# Patient Record
Sex: Male | Born: 1970 | Race: White | Hispanic: No | Marital: Single | Smoking: Current every day smoker
Health system: Southern US, Community
[De-identification: ages and names within clinical notes are randomized; demographics above are authoritative.]

---

## 2014-07-14 ENCOUNTER — Encounter (HOSPITAL_COMMUNITY): Payer: Self-pay | Admitting: Emergency Medicine

## 2014-07-14 ENCOUNTER — Emergency Department (HOSPITAL_COMMUNITY): Payer: BLUE CROSS/BLUE SHIELD

## 2014-07-14 ENCOUNTER — Emergency Department (HOSPITAL_COMMUNITY)
Admission: EM | Admit: 2014-07-14 | Discharge: 2014-07-14 | Disposition: A | Discharge status: Home / Self Care | Payer: BLUE CROSS/BLUE SHIELD | Attending: Emergency Medicine | Admitting: Emergency Medicine

## 2014-07-14 DIAGNOSIS — Y998 Other external cause status: Secondary | ICD-10-CM | POA: Diagnosis not present

## 2014-07-14 DIAGNOSIS — S8391XA Sprain of unspecified site of right knee, initial encounter: Secondary | ICD-10-CM | POA: Diagnosis not present

## 2014-07-14 DIAGNOSIS — Y9389 Activity, other specified: Secondary | ICD-10-CM | POA: Insufficient documentation

## 2014-07-14 DIAGNOSIS — X58XXXA Exposure to other specified factors, initial encounter: Secondary | ICD-10-CM | POA: Diagnosis not present

## 2014-07-14 DIAGNOSIS — Z720 Tobacco use: Secondary | ICD-10-CM | POA: Insufficient documentation

## 2014-07-14 DIAGNOSIS — Y92810 Car as the place of occurrence of the external cause: Secondary | ICD-10-CM | POA: Insufficient documentation

## 2014-07-14 DIAGNOSIS — S8991XA Unspecified injury of right lower leg, initial encounter: Secondary | ICD-10-CM | POA: Diagnosis present

## 2014-07-14 MED ORDER — OXYCODONE-ACETAMINOPHEN 5-325 MG PO TABS
1.0000 | ORAL_TABLET | Freq: Once | ORAL | Status: AC
  Administered 2014-07-14: 1 via ORAL
  Filled 2014-07-14: qty 1

## 2014-07-14 MED ORDER — IBUPROFEN 600 MG PO TABS: 600.0000 mg | ORAL_TABLET | Freq: Four times a day (QID) | ORAL | Status: AC | PRN

## 2014-07-14 MED ORDER — TRAMADOL HCL 50 MG PO TABS: 50.0000 mg | ORAL_TABLET | Freq: Four times a day (QID) | ORAL | Status: AC | PRN

## 2014-07-14 NOTE — ED Notes (Signed)
Declined W/C at D/C and was escorted to lobby by RN. 

## 2014-07-14 NOTE — Discharge Instructions (Signed)
Ibuprofen for pain. Tramadol for severe pain. Keep the knee elevated. Ice several times a day. Ace wrap for compression, swelling, support. Crutches fibrillation as needed. Please follow with her primary care doctor or orthopedic specialist. I have given your referral to an orthopedic specialist. See resource guide to left primary care doctor.   Knee Pain The knee is the complex joint between your thigh and your lower leg. It is made up of bones, tendons, ligaments, and cartilage. The bones that make up the knee are:  The femur in the thigh.  The tibia and fibula in the lower leg.  The patella or kneecap riding in the groove on the lower femur. CAUSES  Knee pain is a common complaint with many causes. A few of these causes are:  Injury, such as:  A ruptured ligament or tendon injury.  Torn cartilage.  Medical conditions, such as:  Gout  Arthritis  Infections  Overuse, over training, or overdoing a physical activity. Knee pain can be minor or severe. Knee pain can accompany debilitating injury. Minor knee problems often respond well to self-care measures or get well on their own. More serious injuries may need medical intervention or even surgery. SYMPTOMS The knee is complex. Symptoms of knee problems can vary widely. Some of the problems are:  Pain with movement and weight bearing.  Swelling and tenderness.  Buckling of the knee.  Inability to straighten or extend your knee.  Your knee locks and you cannot straighten it.  Warmth and redness with pain and fever.  Deformity or dislocation of the kneecap. DIAGNOSIS  Determining what is wrong may be very straight forward such as when there is an injury. It can also be challenging because of the complexity of the knee. Tests to make a diagnosis may include:  Your caregiver taking a history and doing a physical exam.  Routine X-rays can be used to rule out other problems. X-rays will not reveal a cartilage tear. Some  injuries of the knee can be diagnosed by:  Arthroscopy a surgical technique by which a small video camera is inserted through tiny incisions on the sides of the knee. This procedure is used to examine and repair internal knee joint problems. Tiny instruments can be used during arthroscopy to repair the torn knee cartilage (meniscus).  Arthrography is a radiology technique. A contrast liquid is directly injected into the knee joint. Internal structures of the knee joint then become visible on X-ray film.  An MRI scan is a non X-ray radiology procedure in which magnetic fields and a computer produce two- or three-dimensional images of the inside of the knee. Cartilage tears are often visible using an MRI scanner. MRI scans have largely replaced arthrography in diagnosing cartilage tears of the knee.  Blood work.  Examination of the fluid that helps to lubricate the knee joint (synovial fluid). This is done by taking a sample out using a needle and a syringe. TREATMENT The treatment of knee problems depends on the cause. Some of these treatments are:  Depending on the injury, proper casting, splinting, surgery, or physical therapy care will be needed.  Give yourself adequate recovery time. Do not overuse your joints. If you begin to get sore during workout routines, back off. Slow down or do fewer repetitions.  For repetitive activities such as cycling or running, maintain your strength and nutrition.  Alternate muscle groups. For example, if you are a weight lifter, work the upper body on one day and the lower body the  next.  Either tight or weak muscles do not give the proper support for your knee. Tight or weak muscles do not absorb the stress placed on the knee joint. Keep the muscles surrounding the knee strong.  Take care of mechanical problems.  If you have flat feet, orthotics or special shoes may help. See your caregiver if you need help.  Arch supports, sometimes with wedges on the  inner or outer aspect of the heel, can help. These can shift pressure away from the side of the knee most bothered by osteoarthritis.  A brace called an "unloader" brace also may be used to help ease the pressure on the most arthritic side of the knee.  If your caregiver has prescribed crutches, braces, wraps or ice, use as directed. The acronym for this is PRICE. This means protection, rest, ice, compression, and elevation.  Nonsteroidal anti-inflammatory drugs (NSAIDs), can help relieve pain. But if taken immediately after an injury, they may actually increase swelling. Take NSAIDs with food in your stomach. Stop them if you develop stomach problems. Do not take these if you have a history of ulcers, stomach pain, or bleeding from the bowel. Do not take without your caregiver's approval if you have problems with fluid retention, heart failure, or kidney problems.  For ongoing knee problems, physical therapy may be helpful.  Glucosamine and chondroitin are over-the-counter dietary supplements. Both may help relieve the pain of osteoarthritis in the knee. These medicines are different from the usual anti-inflammatory drugs. Glucosamine may decrease the rate of cartilage destruction.  Injections of a corticosteroid drug into your knee joint may help reduce the symptoms of an arthritis flare-up. They may provide pain relief that lasts a few months. You may have to wait a few months between injections. The injections do have a small increased risk of infection, water retention, and elevated blood sugar levels.  Hyaluronic acid injected into damaged joints may ease pain and provide lubrication. These injections may work by reducing inflammation. A series of shots may give relief for as long as 6 months.  Topical painkillers. Applying certain ointments to your skin may help relieve the pain and stiffness of osteoarthritis. Ask your pharmacist for suggestions. Many over the-counter products are approved  for temporary relief of arthritis pain.  In some countries, doctors often prescribe topical NSAIDs for relief of chronic conditions such as arthritis and tendinitis. A review of treatment with NSAID creams found that they worked as well as oral medications but without the serious side effects. PREVENTION  Maintain a healthy weight. Extra pounds put more strain on your joints.  Get strong, stay limber. Weak muscles are a common cause of knee injuries. Stretching is important. Include flexibility exercises in your workouts.  Be smart about exercise. If you have osteoarthritis, chronic knee pain or recurring injuries, you may need to change the way you exercise. This does not mean you have to stop being active. If your knees ache after jogging or playing basketball, consider switching to swimming, water aerobics, or other low-impact activities, at least for a few days a week. Sometimes limiting high-impact activities will provide relief.  Make sure your shoes fit well. Choose footwear that is right for your sport.  Protect your knees. Use the proper gear for knee-sensitive activities. Use kneepads when playing volleyball or laying carpet. Buckle your seat belt every time you drive. Most shattered kneecaps occur in car accidents.  Rest when you are tired. SEEK MEDICAL CARE IF:  You have knee pain  that is continual and does not seem to be getting better.  SEEK IMMEDIATE MEDICAL CARE IF:  Your knee joint feels hot to the touch and you have a high fever. MAKE SURE YOU:   Understand these instructions.  Will watch your condition.  Will get help right away if you are not doing well or get worse. Document Released: 10/25/2006 Document Revised: 03/22/2011 Document Reviewed: 10/25/2006 [facility] Patient Information 2015 [place], [place]. This information is not intended to replace advice given to you by your health care provider. Make sure you discuss any questions you have with your health care  provider.   Emergency Department Resource Guide 1) Find a Doctor and Pay Out of Pocket Although you won't have to find out who is covered by your insurance plan, it is a good idea to ask around and get recommendations. You will then need to call the office and see if the doctor you have chosen will accept you as a new patient and what types of options they offer for patients who are self-pay. Some doctors offer discounts or will set up payment plans for their patients who do not have insurance, but you will need to ask so you aren't surprised when you get to your appointment.  2) Contact Your Local Health Department Not all health departments have doctors that can see patients for sick visits, but many do, so it is worth a call to see if yours does. If you don't know where your local health department is, you can check in your phone book. The CDC also has a tool to help you locate your state's health department, and many state websites also have listings of all of their local health departments.  3) Find a Walk-in Clinic If your illness is not likely to be very severe or complicated, you may want to try a walk in clinic. These are popping up all over the country in pharmacies, drugstores, and shopping centers. They're usually staffed by nurse practitioners or physician assistants that have been trained to treat common illnesses and complaints. They're usually fairly quick and inexpensive. However, if you have serious medical issues or chronic medical problems, these are probably not your best option.  No Primary Care Doctor: - Call Health Connect at  [phone] - they can help you locate a primary care doctor that  accepts your insurance, provides certain services, etc. - Physician Referral Service- [phone]  Chronic Pain Problems: Organization         Address  Phone   Notes  [facility]  [phone] Patients need to be referred by their primary care doctor.    Medication Assistance: Organization         Address  Phone   Notes  [facility] [address] [place], [place] [address] [phone] --Must be a resident of [facility] -- Must have NO insurance coverage whatsoever (no Medicaid/ Medicare, etc.) -- The pt. MUST have a primary care doctor that directs their care regularly and follows them in the community   [facility]ist  [phone]   [employer]  [phone]    Agencies that provide inexpensive medical care: Organization         Address  Phone   Notes  [facility]  [phone]   [facility]    [phone]   [facility] [address] [place], [place] [address] [phone]   [facility] [place] [address] [place], [place] [phone]   Planned Parenthood    [phone]   [facility]    [phone]   [facility]  [address], [place] Phone:  [phone], Fax:  [phone] Hours of Operation:  9 am - 6 pm, M-F.  Also accepts Medicaid/Medicare and self-pay.  [facility]  [address], [place] Phone: [phone], Fax: [phone]. Hours of Operation:  8:30 am - 5:30 pm, M-F.  Also accepts Medicaid and self-pay.  [facility] [place] [address] [place] Phone: [phone]   [facility] [address] Natasha Bence [place], [place] [phone], Ext. [phone] Mondays & Thursdays: 7-9 AM.  First 15 patients are seen on a first come, first serve basis.    Medicaid-accepting [facility] Providers:  Organization         Address  Phone   Notes  [facility] [address], [place] [phone] Also accepts self-pay patients.  [facility] [address] Laurell Josephs [place], [place]  [phone]   [facility] [address], [place] [phone]   [facility] [address], [place] [phone]   Renaye Rakers [address], [place]   [phone] Only accepts [place] patients after they have their name applied to their card.   Self-Pay (no insurance) in [facility]:  Organization         Address  Phone   Notes  Sickle Cell Patients, [facility] [address] [place], [place] [phone]   [facility] [address] [place], [place] [phone]   [facility] [place]  [address], [place] [phone]   [facility]/Dr. Osei-Bonsu  [address] [place] or [address], [place] [phone] Phone number for both [place] and [place] locations is the same.  [facility] [address] [place] [phone]   [facility] [address] [place] or [address] [phone]   [facility] [address] [place] [phone], phone; [phone], fax Sees patients 1st and 3rd Saturday of every month.  Must not qualify for public or private insurance (i.e. Medicaid, Medicare, [place] Health Choice, Veterans' Benefits)  Household income should be no more than 200% of the poverty level The clinic cannot treat you if you are pregnant or think you are pregnant  Sexually transmitted diseases are not treated at the clinic.    Dental Care: Organization         Address  Phone  Notes  [facility] [address] [place], [place] [phone] Accepts children up to age 81 who are enrolled in [place] or [place] Health Choice; pregnant women with a Medicaid card; and children who have applied for Medicaid or [place] Health Choice, but were declined, whose parents can pay a reduced fee at time of service.    [facility] of [facility]  [address], [place] [phone] Accepts children up to age 72 who are enrolled in [place] or  Health Choice; pregnant women with a Medicaid card; and  children who have applied for Medicaid or [place] Health Choice, but were declined, whose parents can pay a reduced fee at time of service.  [facility]  [address] [place], [place] [phone] Patients are seen by appointment only. Walk-ins are not accepted. [facility] will see patients 33 years of age and older. Monday - Tuesday (8am-5pm) Most Wednesdays (8:30-5pm) $30 per visit, cash only  [facility]  [address], [facility] [phone] Patients are seen by appointment only. Walk-ins are not accepted. [facility] will see patients 55 years of age and older. One Wednesday Evening (Monthly: Volunteer Based).  $30 per visit, cash only  [employer]  [phone] for adults; Children under age 55, call Graduate Pediatric Dentistry at [phone]. Children aged 47-14, please call [phone] to request a pediatric application.  Dental services are provided in all areas of dental care including fillings, crowns and bridges, complete and partial dentures, implants, gum treatment, root canals, and extractions. Preventive care is also provided. Treatment is provided to both adults and children. Patients are selected via a lottery and there is often a waiting list.   [facility] [address], [place]  [phone] [email or web address]   [facility] [address] [place], [place] [phone], Ext. [phone] Second and Fourth Thursday of each month, opens at 6:30 AM; Clinic ends at 9 AM.  Patients are seen on a first-come first-served basis, and a limited number are seen during each clinic.   [facility]  [address] [place], [place] [phone]   Eligibility Requirements You must have lived in [place], [place], or [place] counties for at least the last three months.   You cannot be eligible for state or federal sponsored [place], including [employer], [place], or [employer].   You generally cannot be eligible for healthcare insurance through your employer.    How to apply: Eligibility screenings are held every Tuesday and Wednesday afternoon from 1:00 pm until 4:00 pm. You do not need an appointment for the interview!  [facility] [address], [place], [place] [phone]   [facility] Health [facility]  [phone]   [facility] Health Department  [phone]   [facility] Health Department  [phone]    Behavioral Health Resources in the Community: Intensive Outpatient Programs Organization         Address  Phone  Notes  [facility] Services [address] [place], [place] [phone]   [facility] Outpatient [address] [place], [place] [phone]   [facility] [address], [place], [place]  [phone]   [facility] [address]  [place], [place] [phone] or [phone]   Substance Abuse Resources Organization         Address  Phone  Notes  [facility] Drug Services  [phone]   [facility]  [phone]   [facility] [place]  [phone]   Floydene Flock  [phone]   Residential & Outpatient Substance Abuse Program  [phone]   Psychological Services Organization         Address  Phone  Notes  [facility] Behavioral Health  [phone]   [facility] Services  [phone]   [facility] Mental Health [address] [place] [phone] or [phone]    Mobile Crisis Teams Organization  Address  Phone  Notes  [facility], Mobile Crisis Care Unit  [phone]   [facility]  [address] [place], [place] [phone]   [facility] [address] [place] [phone]    Self-Help/Support Groups Organization         Address  Phone             Notes  [facility] of [place] - variety of support groups  [phone]- [phone] Call for more information  Narcotics Anonymous (NA), [facility] [address], [place]  2 meetings at this location   Statisticianesidential Treatment Programs Organization         Address  Phone  Notes  [facility] [address],    [place]  [phone]   [facility]  [address], [place] [phone], [place], [place] [phone]   [facility] [address], [place] [phone] Admissions: 8am-3pm M-F  [facility] [address]    [place], [place] [phone]   [facility] [address], [place], [place] [phone]   [facility] [address]  [place], [place] [phone]   [facility] - Intensive Outpatient [address], [place], [place] [phone]   [facility] ([facility] [address]  [place], [place] [phone] or [phone]   [facility] ([facility]) [address] [place], [place] [phone] Accepts Medicaid  [facility] [address]  [place] [phone] Substance Abuse/Addiction Treatment   [facility] Resources Organization         Address  Phone  Notes  [facility]  [phone]) [phone]   Angie FavaJulie Brannon, PhD [address], [address] [place], [place]   [phone]) [phone] or [phone](336) [phone]   [facility]   [address] [place], [place] [phone]) [phone]   [facility] [address], [place], [place] [phone]) [phone] Insurance/Medicaid/sponsorship through [facility]  [facility] [address]                                    [place], [place] [phone]) [phone]  Therapy/tele-psych/case  [facility] [address]   [place], [place] [phone](336) [phone]    Dr. Lolly MustacheArfeen  [phone](336) [phone]   [facility] [place] [facility]  [employer] [facility] 1) [address], [place] 2) [address], [place] 3)  [address], [place] [phone](336) [phone]  [phone]) [phone]   [facility] Child Abuse Hotline [phone](336) [phone] or [phone]) [phone] (After Hours)

## 2014-07-14 NOTE — ED Notes (Signed)
Pt. Stated, i was getting out of the car yesterday and my rt. Knee twisted.

## 2014-07-14 NOTE — ED Provider Notes (Signed)
CSN: [id number]     Arrival date & time 07/14/14  0942 History  This chart was scribed for non-physician practitioner, Jaynie Crumble, PA-C working with Mancel Bale, MD by Freida Busman, ED Scribe. This patient was seen in room TR06C/TR06C and the patient's care was started at 11:01 AM.      Chief Complaint  Patient presents with  . Knee Pain   The history is provided by the patient. No language interpreter was used.     HPI Comments:  Douglas Ruiz is a 44 y.o. male who presents to the Emergency Department complaining of constant right knee pain since yesterday am. Pt states he may have popped" the knee getting out of his car yesterday . He reports associated swelling that started last night. He has been taking ibuprofen with minimal relief. Pt has a h/o right menisicus surgery in 2012 but has not had any problems since. No associated symptoms noted.   History reviewed. No pertinent past medical history. History reviewed. No pertinent past surgical history. No family history on file. History  Substance Use Topics  . Smoking status: Current Every Day Smoker  . Smokeless tobacco: Not on file  . Alcohol Use: Yes    Review of Systems  Constitutional: Negative for fever and chills.  Musculoskeletal: Positive for myalgias and joint swelling.      Allergies  Review of patient's allergies indicates not on file.  Home Medications   Prior to Admission medications   Not on File   BP 128/70 mmHg  Pulse 80  Temp(Src) 98.1 F (36.7 C) (Oral)  Resp 18  Ht  (1.702 m)  Wt 214 lb (97.07 kg)  BMI 33.51 kg/m2  SpO2 97% Physical Exam  Constitutional: He is oriented to person, place, and time. He appears well-developed and well-nourished. No distress.  HENT:  Head: Normocephalic and atraumatic.  Eyes: Conjunctivae are normal.  Neck: Normal range of motion.  Cardiovascular: Normal rate.   Pulmonary/Chest: Effort normal.  Musculoskeletal: Normal range of motion.  No  obvious swelling noted to the right knee. Pain with full flexion and full extension of the right knee joint. Tender to palpation over medial joint. Negative anterior posterior drawer signs. No laxity of medial lateral stress. Patella tendon is intact.dorsal pedal pulses intact and equal   Neurological: He is alert and oriented to person, place, and time.  Skin: Skin is warm and dry.  Nursing note and vitals reviewed.   ED Course  Procedures   DIAGNOSTIC STUDIES:  Oxygen Saturation is 97% on RA, normal by my interpretation.    COORDINATION OF CARE:  11:04 AMWill discharge with crutches, ace wrap and referral to the wellness center.  Discussed treatment plan with pt at bedside and pt agreed to plan.  Labs Review Labs Reviewed - No data to display  Imaging Review Dg Knee Complete 4 Views Right  07/14/2014   CLINICAL DATA:  Right knee pain and medial swelling. Heard a pop yesterday. Initial encounter. Torn meniscus with surgery in 2012.  EXAM: RIGHT KNEE - COMPLETE 4+ VIEW  COMPARISON:  None.  FINDINGS: No acute fracture or dislocation. Joint spaces maintained. No joint effusion.  IMPRESSION: No acute osseous abnormality.   Electronically Signed   By: Jeronimo Greaves M.D.   On: 07/14/2014 10:45     EKG Interpretation None      MDM   Final diagnoses:  Right knee sprain, initial encounter    patient with right knee pain after stepping on it and  twisting it yesterday. Pain is mainly medial. History of meniscus tear. Joint is stable. Neurovascular intact. Ace wrap, crutches, follow-up with orthopedic specialist. X-ray negative.  Filed Vitals:   07/14/14 1002  BP: 128/70  Pulse: 80  Temp: 98.1 F (36.7 C)  TempSrc: Oral  Resp: 18  Height: 5\' 7"  (1.702 m)  Weight: 214 lb (97.07 kg)  SpO2: 97%     I personally performed the services described in this documentation, which was scribed in my presence. The recorded information has been reviewed and is accurate.   Jaynie Crumbleatyana  Dashanique Brownstein, PA-C 07/14/14 1132  Mancel BaleElliott Wentz, MD 07/14/14 [phone]

## 2016-12-04 IMAGING — CR DG KNEE COMPLETE 4+V*R*
4 series · 4 of 4 positions shown · non-contrast
Comparison: None.

CLINICAL DATA: Right knee pain and medial swelling. Heard a pop
yesterday. Initial encounter. Torn meniscus with surgery in 5385.

EXAM:
RIGHT KNEE - COMPLETE 4+ VIEW

[knee ap]
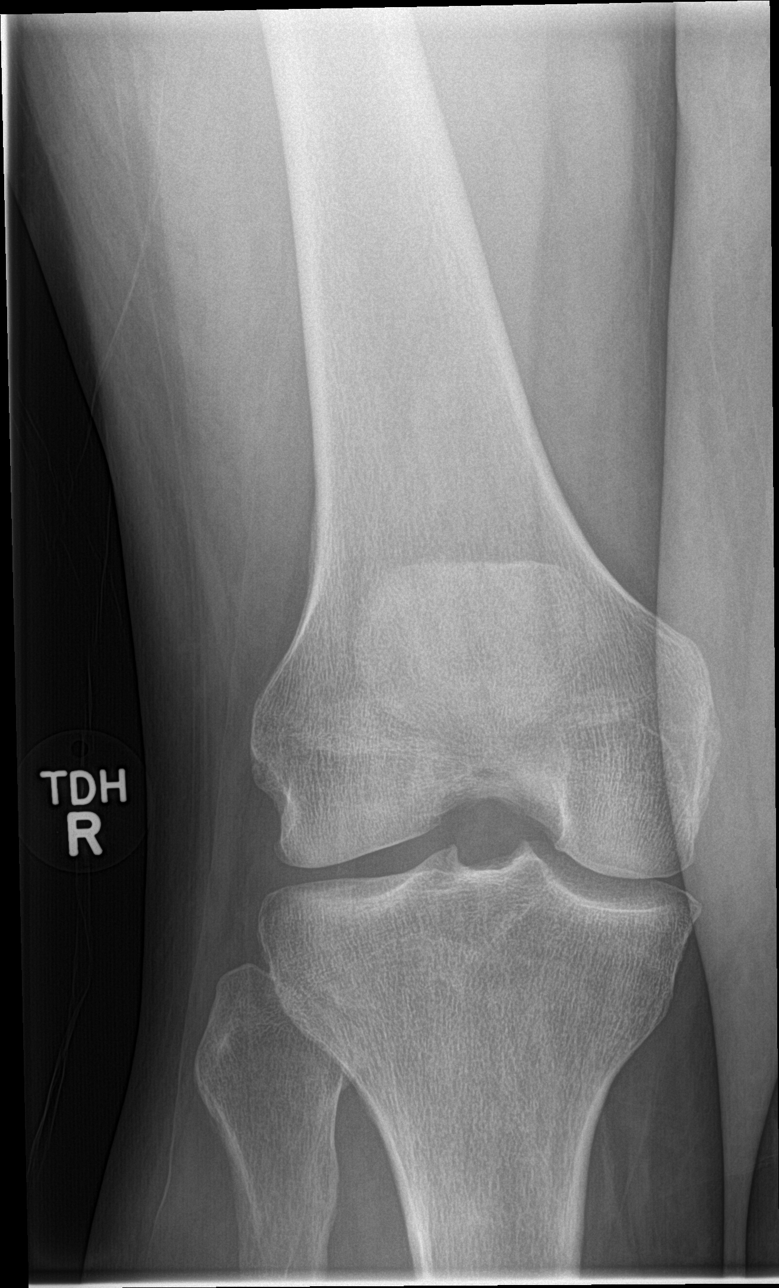

[tunnel]
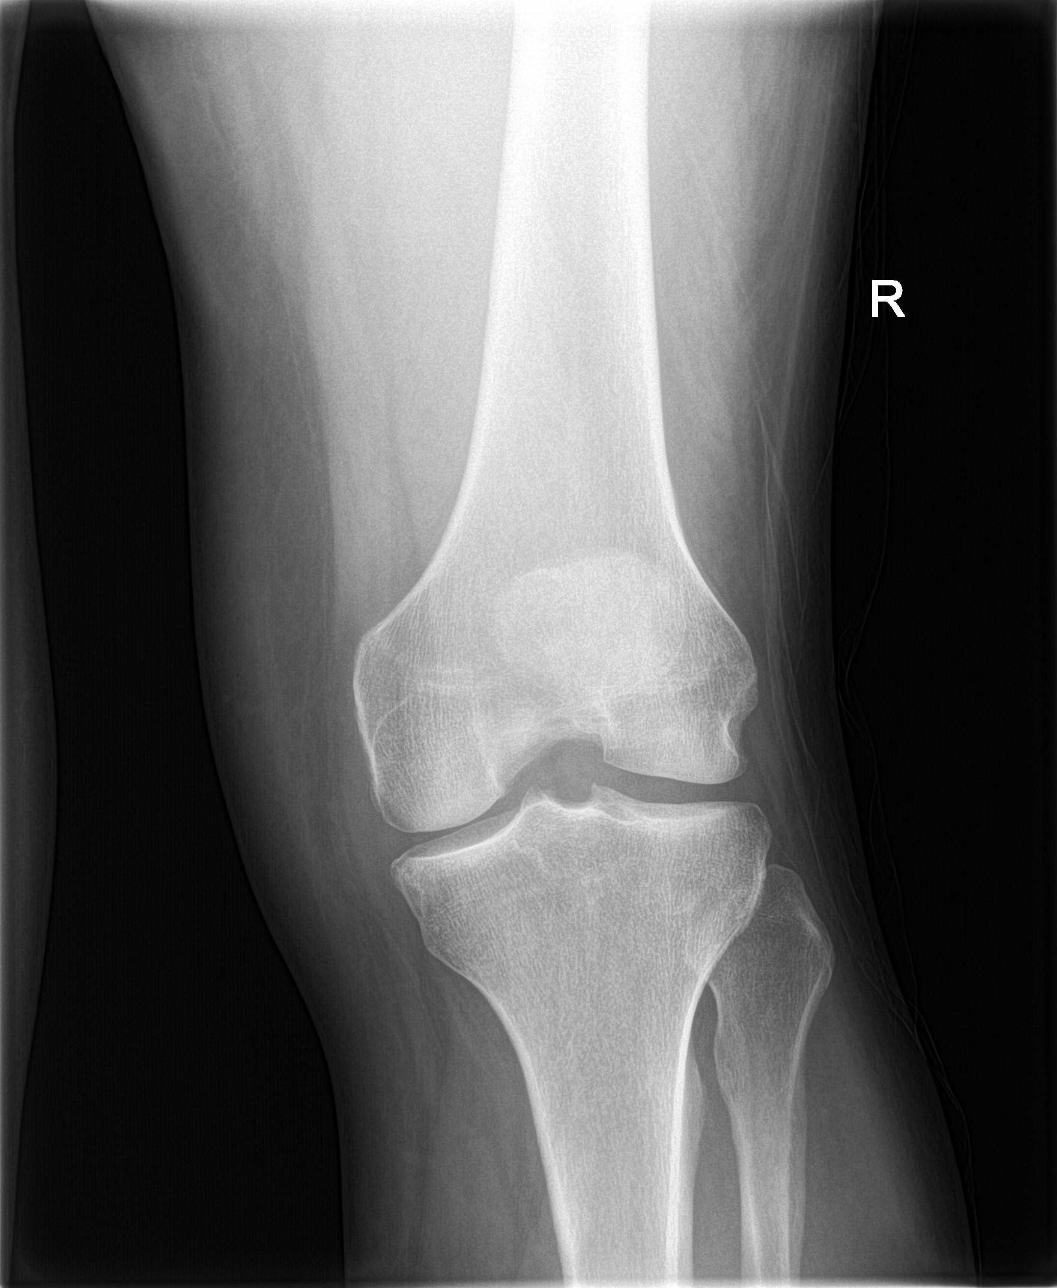

[knee lat]
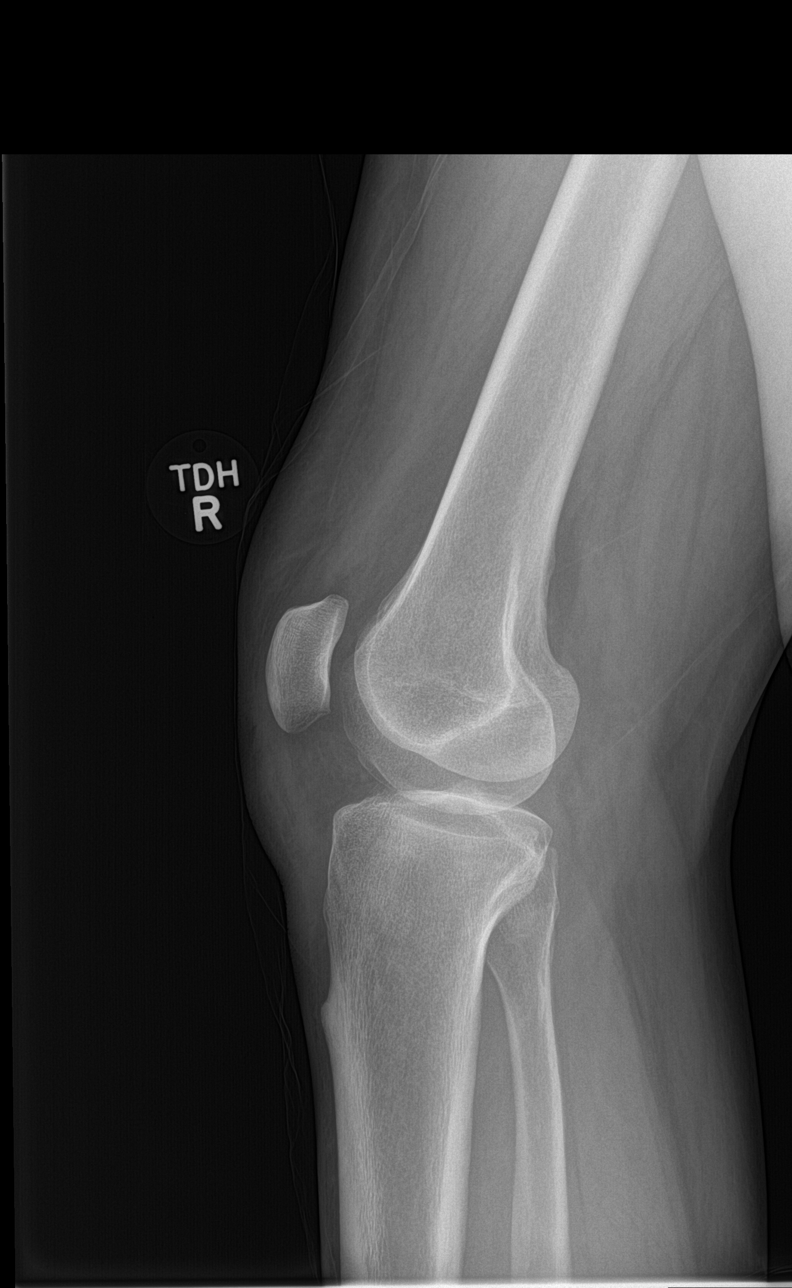

[knee sunrise]
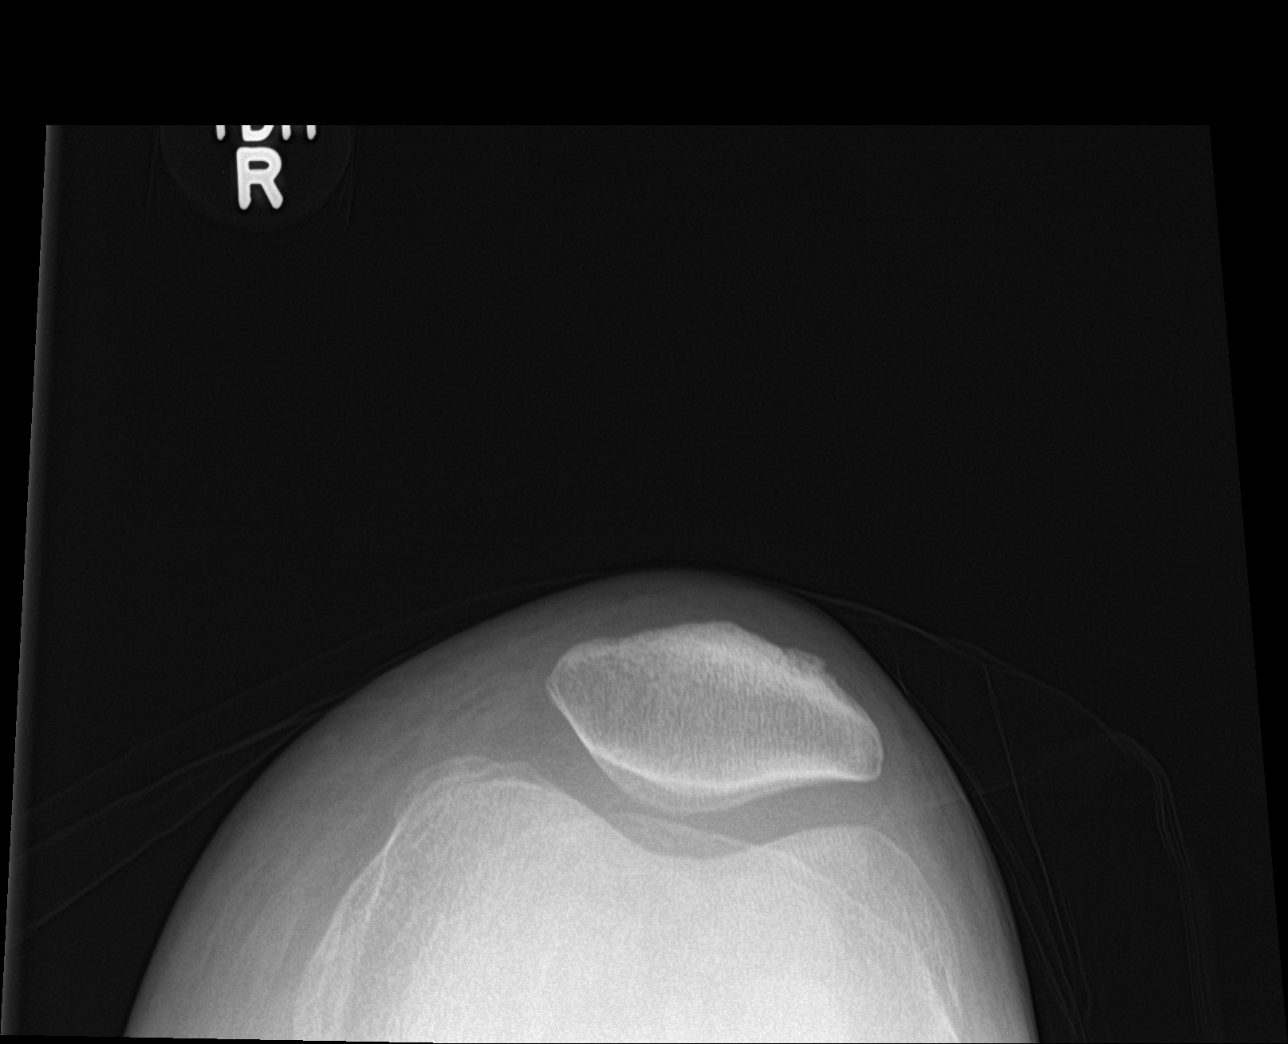

[4 of 4 positions shown; findings below may reference images not displayed]

FINDINGS: No acute fracture or dislocation. Joint spaces maintained. No joint
effusion.
IMPRESSION: No acute osseous abnormality.
